# Patient Record
Sex: Male | Born: 1985 | Race: White | Hispanic: No | Marital: Married | Smoking: Never smoker
Health system: Southern US, Community
[De-identification: ages and names within clinical notes are randomized; demographics above are authoritative.]

---
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
## 2008-02-12 ENCOUNTER — Ambulatory Visit: Payer: Self-pay | Admitting: Family Medicine

## 2009-01-25 ENCOUNTER — Ambulatory Visit: Payer: Self-pay | Admitting: Internal Medicine

## 2010-09-23 IMAGING — CR RIGHT HAND - COMPLETE 3+ VIEW
1 series · 3 of 3 positions shown · non-contrast
Comparison: None

REASON FOR EXAM: injury, pain, swelling
COMMENTS:

PROCEDURE:     MDR - MDR HAND RT COMP W/OBLIQUES  - January 25, 2009  [DATE]
RESULT:     History: History: Injury

[Series 1: view not recorded · 0.17mm/px · 3 of 3 slices shown]
[im 1/3]
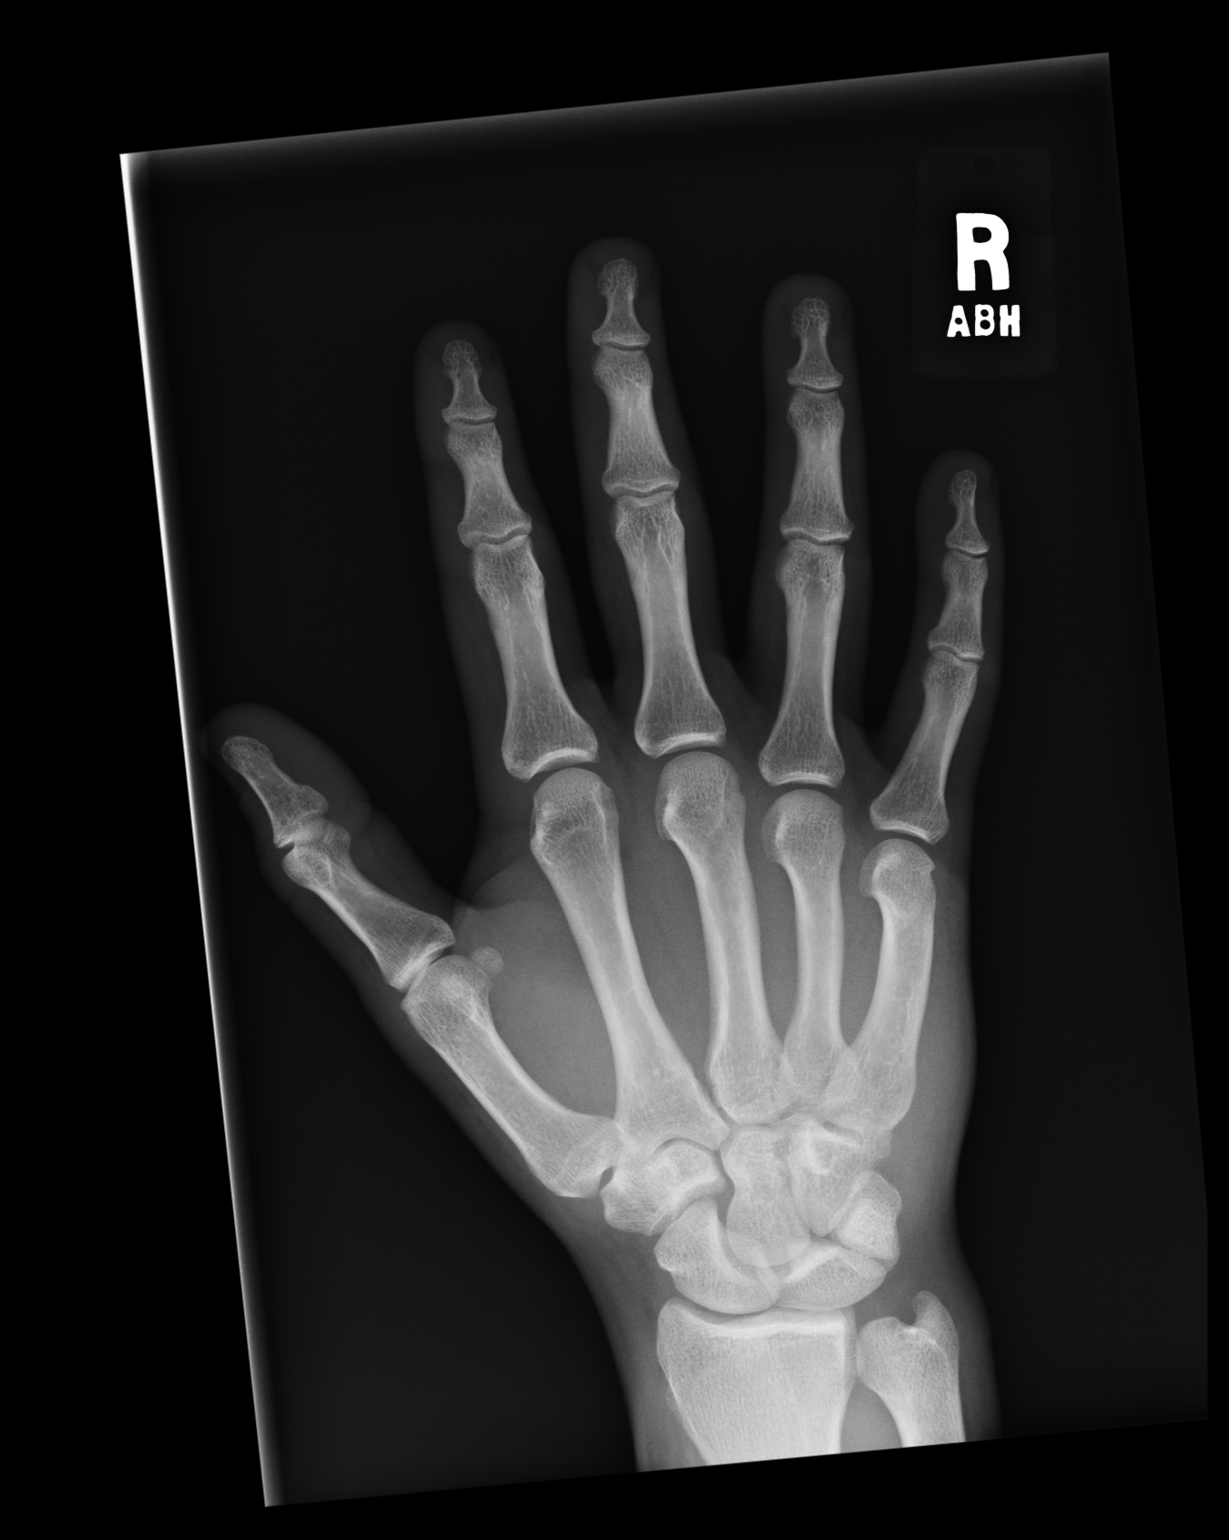
[im 2/3]
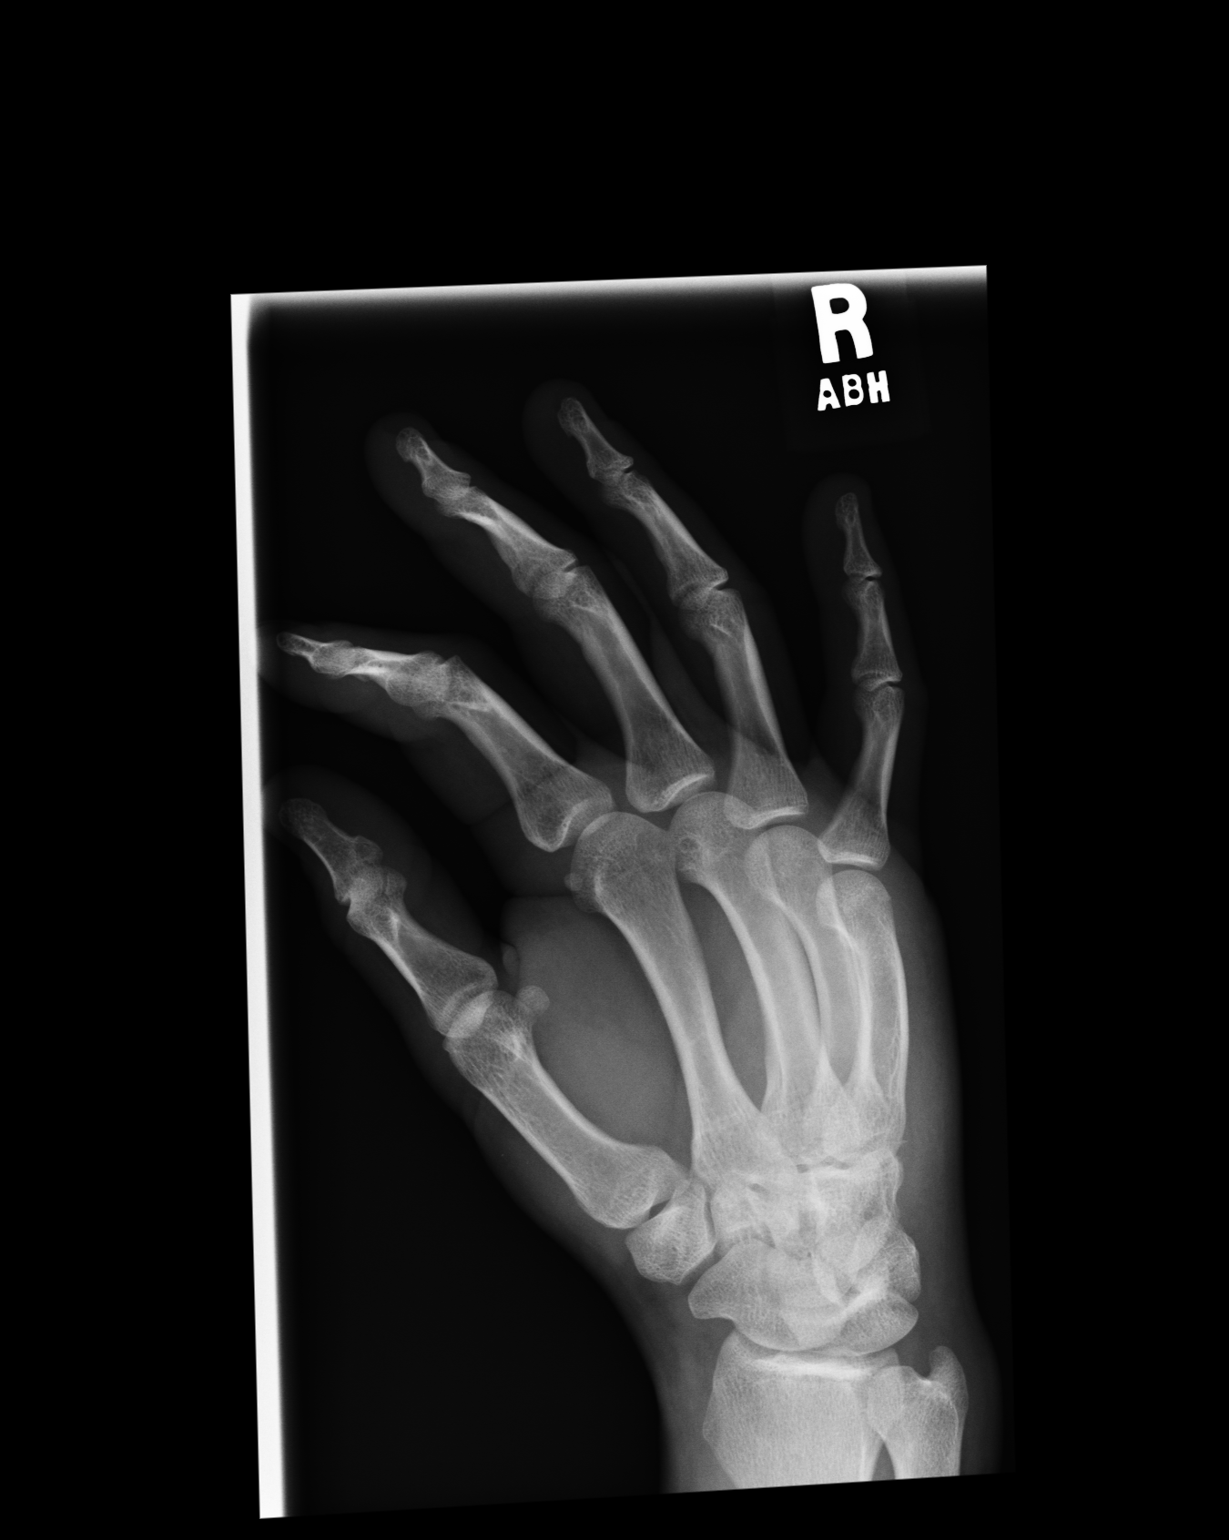
[im 3/3]
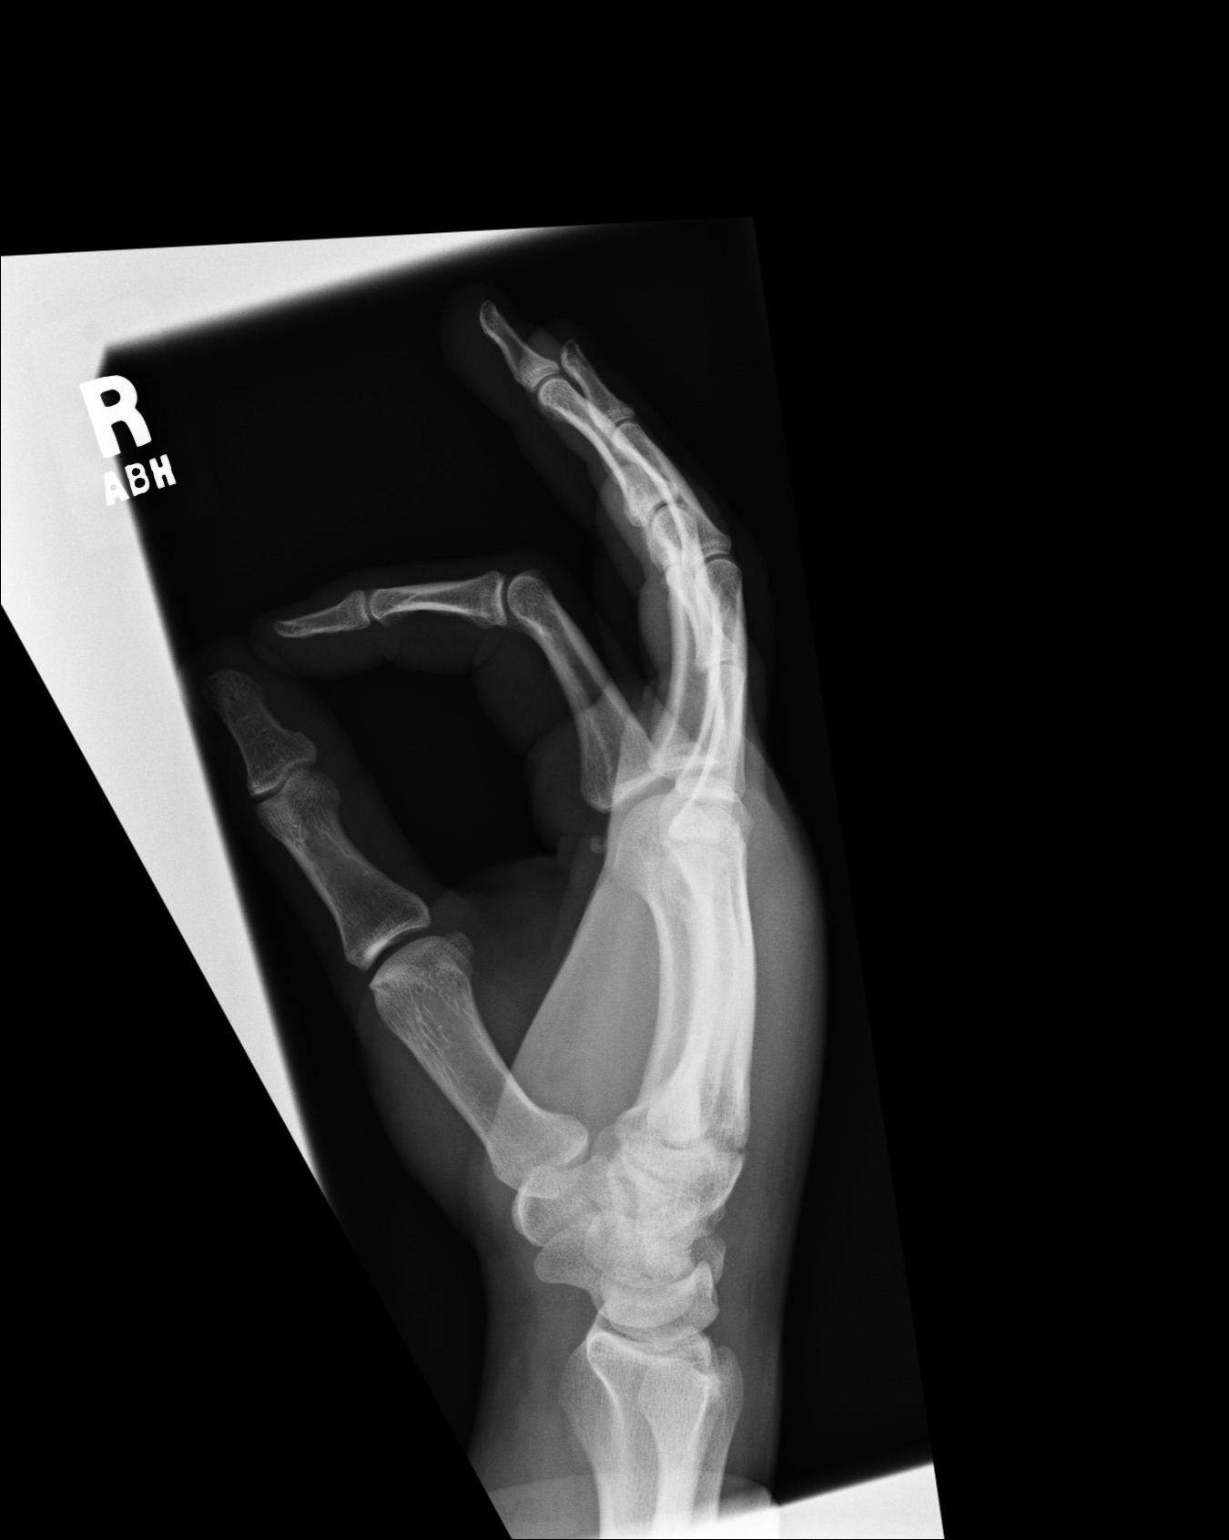

[3 of 3 positions shown; findings below may reference images not displayed]

FINDINGS: AP, oblique, and lateral views of the right hand demonstrates no fracture or
dislocation. There is normal bone mineralization. There are no erosive
changes. The joint spaces are maintained. There is no soft tissue swelling.
IMPRESSION: No acute osseous abnormality of the right hand.

## 2022-02-25 ENCOUNTER — Encounter: Payer: Self-pay | Admitting: Physician Assistant

## 2022-02-25 ENCOUNTER — Ambulatory Visit: Payer: Self-pay | Admitting: Physician Assistant

## 2022-02-25 ENCOUNTER — Ambulatory Visit: Payer: Self-pay

## 2022-02-25 VITALS — BP 136/85 | HR 82 | Temp 98.2°F | Resp 16 | Ht 76.0 in | Wt 265.0 lb

## 2022-02-25 DIAGNOSIS — M545 Low back pain, unspecified: Secondary | ICD-10-CM | POA: Insufficient documentation

## 2022-02-25 DIAGNOSIS — Z0289 Encounter for other administrative examinations: Secondary | ICD-10-CM | POA: Insufficient documentation

## 2022-02-25 DIAGNOSIS — Z814 Family history of other substance abuse and dependence: Secondary | ICD-10-CM | POA: Insufficient documentation

## 2022-02-25 DIAGNOSIS — F17200 Nicotine dependence, unspecified, uncomplicated: Secondary | ICD-10-CM | POA: Insufficient documentation

## 2022-02-25 DIAGNOSIS — Z021 Encounter for pre-employment examination: Secondary | ICD-10-CM

## 2022-02-25 DIAGNOSIS — T7840XA Allergy, unspecified, initial encounter: Secondary | ICD-10-CM | POA: Insufficient documentation

## 2022-02-25 DIAGNOSIS — Z0110 Encounter for examination of ears and hearing without abnormal findings: Secondary | ICD-10-CM | POA: Insufficient documentation

## 2022-02-25 DIAGNOSIS — H527 Unspecified disorder of refraction: Secondary | ICD-10-CM | POA: Insufficient documentation

## 2022-02-25 DIAGNOSIS — F4310 Post-traumatic stress disorder, unspecified: Secondary | ICD-10-CM | POA: Insufficient documentation

## 2022-02-25 DIAGNOSIS — K2970 Gastritis, unspecified, without bleeding: Secondary | ICD-10-CM | POA: Insufficient documentation

## 2022-02-25 LAB — POCT URINALYSIS DIPSTICK
Bilirubin, UA: NEGATIVE
Blood, UA: NEGATIVE
Glucose, UA: NEGATIVE
Ketones, UA: NEGATIVE
Leukocytes, UA: NEGATIVE
Nitrite, UA: NEGATIVE
Protein, UA: NEGATIVE
Spec Grav, UA: 1.01 (ref 1.010–1.025)
Urobilinogen, UA: 0.2 E.U./dL
pH, UA: 6 (ref 5.0–8.0)

## 2022-02-25 NOTE — Progress Notes (Unsigned)
Pt presents today to complete [employer] pre-employment physical and labs.

## 2022-02-25 NOTE — Progress Notes (Signed)
[facility]  ____________________________________________   None    (approximate)  I have reviewed the triage vital signs and the nursing notes.   HISTORY  Chief Complaint No chief complaint on file.   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  Patient voices no concerns or complaints.        History reviewed. No pertinent past medical history.  Patient Active Problem List   Diagnosis Date Noted   Allergies 02/25/2022   Family history of other substance abuse and dependence 02/25/2022   Gastritis 02/25/2022   Health examination of defined subpopulation 02/25/2022   Low back pain, unspecified 02/25/2022   Nicotine dependence 02/25/2022   Other examination of ears and hearing 02/25/2022   Post-traumatic stress disorder 02/25/2022   Refractive error 02/25/2022    History reviewed. No pertinent surgical history.  Prior to Admission medications   Medication Sig Start Date End Date Taking? Authorizing Provider  cyclobenzaprine (FLEXERIL) 10 MG tablet Take by mouth. 07/05/16  Yes [provider]  naproxen (NAPROSYN) 500 MG tablet Take by mouth. 07/09/16  Yes [provider]    Allergies Penicillins and Sulfa antibiotics  Family History  Problem Relation Age of Onset   Rheum arthritis Father     Social History Social History   Tobacco Use   Smoking status: Never   Smokeless tobacco: Current    Types: Snuff  Substance Use Topics   Alcohol use: Yes   Drug use: Never    Review of Systems Constitutional: No fever/chills Eyes: No visual changes. ENT: No sore throat. Cardiovascular: Denies chest pain. Respiratory: Denies shortness of breath. Gastrointestinal: No abdominal pain.  No nausea, no vomiting.  No diarrhea.  No constipation. Genitourinary: Negative for dysuria. Musculoskeletal: Negative for back pain. Skin: Negative for rash. Neurological:  Negative for headaches, focal weakness or numbness. Allergic/Immunilogical: Penicillin and sulfa ____________________________________________   PHYSICAL EXAM:  VITAL SIGNS: BP is 136/85, pulse 82, respirations 16, temperature 98.2, and patient is 90% O2 sat on room air.  Patient weighs 265 pounds and BMI is 32.26. Constitutional: Alert and oriented. Well appearing and in no acute distress. Eyes: Conjunctivae are normal. PERRL. EOMI. Head: Atraumatic. Nose: No congestion/rhinnorhea. Mouth/Throat: Mucous membranes are moist.  Oropharynx non-erythematous. Neck: No stridor.  No cervical spine tenderness to palpation. Hematological/Lymphatic/Immunilogical: No cervical lymphadenopathy. Cardiovascular: Normal rate, regular rhythm. Grossly normal heart sounds.  Good peripheral circulation. Respiratory: Normal respiratory effort.  No retractions. Lungs CTAB. Gastrointestinal: Soft and nontender. No distention. No abdominal bruits. No CVA tenderness. Genitourinary: Deferred Musculoskeletal: No lower extremity tenderness nor edema.  No joint effusions. Neurologic:  Normal speech and language. No gross focal neurologic deficits are appreciated. No gait instability. Skin:  Skin is warm, dry and intact. No rash noted. Psychiatric: Mood and affect are normal. Speech and behavior are normal.  ____________________________________________   LABS     Component Ref Range & Units 13:52  Color, UA  yellow   Clarity, UA  clear   Glucose, UA Negative Negative   Bilirubin, UA  negative   Ketones, UA  negative   Spec Grav, UA 1.010 - 1.025 1.010   Blood, UA  negative   pH, UA 5.0 - 8.0 6.0   Protein, UA Negative Negative   Urobilinogen, UA 0.2 or 1.0 E.U./dL 0.2   Nitrite, UA  negative   Leukocytes, UA Negative Negative   Appearance  ____________________________________________  EKG Sinus  Rhythm at 73 bpm WITHIN NORMAL  LIMITS   ____________________________________________    ____________________________________________   INITIAL IMPRESSION / ASSESSMENT AND PLAN  As part of my medical decision making, I reviewed the following data within the electronic MEDICAL RECORD NUMBER       Advise patient lab results are pending.  No acute findings on physical exam.      ____________________________________________   FINAL CLINICAL IMPRESSION Well exam   ED Discharge Orders     None        Note:  This document was prepared using Dragon voice recognition software and may include unintentional dictation errors.

## 2022-02-25 NOTE — Progress Notes (Signed)
Pt presents today to complete [employer] pre-employment physical. Pt denies any issues or concerns./CL,RMA  

## 2022-02-26 LAB — CMP12+LP+TP+TSH+6AC+CBC/D/PLT
ALT: 27 IU/L (ref 0–44)
AST: 19 IU/L (ref 0–40)
Albumin/Globulin Ratio: 2 (ref 1.2–2.2)
Albumin: 4.9 g/dL (ref 4.1–5.1)
Alkaline Phosphatase: 58 IU/L (ref 44–121)
BUN/Creatinine Ratio: 6 — ABNORMAL LOW (ref 9–20)
BUN: 9 mg/dL (ref 6–20)
Basophils Absolute: 0 10*3/uL (ref 0.0–0.2)
Basos: 1 %
Bilirubin Total: 0.4 mg/dL (ref 0.0–1.2)
Calcium: 9.9 mg/dL (ref 8.7–10.2)
Chloride: 102 mmol/L (ref 96–106)
Chol/HDL Ratio: 3.4 ratio (ref 0.0–5.0)
Cholesterol, Total: 223 mg/dL — ABNORMAL HIGH (ref 100–199)
Creatinine, Ser: 1.55 mg/dL — ABNORMAL HIGH (ref 0.76–1.27)
EOS (ABSOLUTE): 0.3 10*3/uL (ref 0.0–0.4)
Eos: 6 %
Estimated CHD Risk: 0.5 times avg. (ref 0.0–1.0)
Free Thyroxine Index: 2.2 (ref 1.2–4.9)
GGT: 83 IU/L — ABNORMAL HIGH (ref 0–65)
Globulin, Total: 2.4 g/dL (ref 1.5–4.5)
Glucose: 86 mg/dL (ref 70–99)
HDL: 65 mg/dL (ref >39)
Hematocrit: 44.4 % (ref 37.5–51.0)
Hemoglobin: 15.2 g/dL (ref 13.0–17.7)
Immature Grans (Abs): 0 10*3/uL (ref 0.0–0.1)
Immature Granulocytes: 0 %
Iron: 90 ug/dL (ref 38–169)
LDH: 146 IU/L (ref 121–224)
LDL Chol Calc (NIH): 144 mg/dL — ABNORMAL HIGH (ref 0–99)
Lymphocytes Absolute: 1.1 10*3/uL (ref 0.7–3.1)
Lymphs: 21 %
MCH: 30.5 pg (ref 26.6–33.0)
MCHC: 34.2 g/dL (ref 31.5–35.7)
MCV: 89 fL (ref 79–97)
Monocytes Absolute: 0.5 10*3/uL (ref 0.1–0.9)
Monocytes: 10 %
Neutrophils Absolute: 3.2 10*3/uL (ref 1.4–7.0)
Neutrophils: 62 %
Phosphorus: 2.9 mg/dL (ref 2.8–4.1)
Platelets: 283 10*3/uL (ref 150–450)
Potassium: 4.7 mmol/L (ref 3.5–5.2)
RBC: 4.98 x10E6/uL (ref 4.14–5.80)
RDW: 11.8 % (ref 11.6–15.4)
Sodium: 140 mmol/L (ref 134–144)
T3 Uptake Ratio: 31 % (ref 24–39)
T4, Total: 7.1 ug/dL (ref 4.5–12.0)
TSH: 1.57 u[IU]/mL (ref 0.450–4.500)
Total Protein: 7.3 g/dL (ref 6.0–8.5)
Triglycerides: 81 mg/dL (ref 0–149)
Uric Acid: 7.1 mg/dL (ref 3.8–8.4)
VLDL Cholesterol Cal: 14 mg/dL (ref 5–40)
WBC: 5.1 10*3/uL (ref 3.4–10.8)
eGFR: 60 mL/min/{1.73_m2} (ref >59)

## 2022-02-26 LAB — HEPATITIS B SURFACE ANTIBODY,QUALITATIVE: Hep B Surface Ab, Qual: REACTIVE

## 2022-04-20 DIAGNOSIS — S60862A Insect bite (nonvenomous) of left wrist, initial encounter: Secondary | ICD-10-CM | POA: Diagnosis not present

## 2023-08-25 ENCOUNTER — Ambulatory Visit: Payer: Self-pay

## 2023-08-25 DIAGNOSIS — Z0000 Encounter for general adult medical examination without abnormal findings: Secondary | ICD-10-CM

## 2023-08-25 DIAGNOSIS — R5383 Other fatigue: Secondary | ICD-10-CM

## 2023-08-25 LAB — POCT URINALYSIS DIPSTICK
Bilirubin, UA: NEGATIVE
Blood, UA: NEGATIVE
Glucose, UA: NEGATIVE
Ketones, UA: NEGATIVE
Leukocytes, UA: NEGATIVE
Nitrite, UA: NEGATIVE
Protein, UA: NEGATIVE
Spec Grav, UA: <=1.005 — AB (ref 1.010–1.025)
Urobilinogen, UA: 0.2 U/dL
pH, UA: 7 (ref 5.0–8.0)

## 2023-08-26 LAB — TESTOSTERONE: Testosterone: 345 ng/dL (ref 264–916)

## 2023-08-30 ENCOUNTER — Ambulatory Visit: Payer: Self-pay | Admitting: Physician Assistant

## 2023-08-30 VITALS — BP 142/93 | HR 90 | Temp 97.6°F | Resp 16 | Ht 76.0 in | Wt 260.0 lb

## 2023-08-30 DIAGNOSIS — Z0000 Encounter for general adult medical examination without abnormal findings: Secondary | ICD-10-CM

## 2023-08-30 NOTE — Progress Notes (Signed)
 Pt presents today to complete [facility] physical, Pt request to go over labs. Frann Ivans

## 2023-08-30 NOTE — Progress Notes (Signed)
   [facility] ____________________________________________   None    (approximate)  I have reviewed the triage vital signs and the nursing notes.   HISTORY  Chief Complaint No chief complaint on file.   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  Voices no concerns or complaints         Patient Active Problem List   Diagnosis Date Noted   Allergies 02/25/2022   Family history of other substance abuse and dependence 02/25/2022   Gastritis 02/25/2022   Health examination of defined subpopulation 02/25/2022   Low back pain, unspecified 02/25/2022   Nicotine dependence 02/25/2022   Other examination of ears and hearing 02/25/2022   Post-traumatic stress disorder 02/25/2022   Refractive error 02/25/2022    No past surgical history on file.  Prior to Admission medications   Not on File    Allergies Penicillins and Sulfa antibiotics  Family History  Problem Relation Age of Onset   Rheum arthritis Father     Social History Social History   Tobacco Use   Smoking status: Never   Smokeless tobacco: Current    Types: Snuff  Substance Use Topics   Alcohol use: Yes   Drug use: Never    Review of Systems Constitutional: No fever/chills Eyes: No visual changes. ENT: No sore throat. Cardiovascular: Denies chest pain. Respiratory: Denies shortness of breath. Gastrointestinal: No abdominal pain.  No nausea, no vomiting.  No diarrhea.  No constipation. Genitourinary: Negative for dysuria. Musculoskeletal: Negative for back pain. Skin: Negative for rash. Neurological: Negative for headaches, focal weakness or numbness. Psychiatric: PTSD Allergic/Immunilogical: Penicillin and sulfa  ____________________________________________   PHYSICAL EXAM:  VITAL SIGNS: BP 142/93BP. 142/93. Data is abnormal. Taken on 08/30/23 9:46 AM  Cuff Size Large  Pulse Rate 90  Temp 97.6 F (36.4 C)   Weight 260 lb (117.9 kg)  Height 6\' 4"  (1.93 m)  Resp 16  SpO2 100 %   Constitutional: Alert and oriented. Well appearing and in no acute distress. Eyes: Conjunctivae are normal. PERRL. EOMI. Head: Atraumatic. Nose: No congestion/rhinnorhea. Mouth/Throat: Mucous membranes are moist.  Oropharynx non-erythematous. Neck: No stridor.  No cervical spine tenderness to palpation. Hematological/Lymphatic/Immunilogical: No cervical lymphadenopathy. Cardiovascular: Normal rate, regular rhythm. Grossly normal heart sounds.  Good peripheral circulation. Respiratory: Normal respiratory effort.  No retractions. Lungs CTAB. Gastrointestinal: Soft and nontender. No distention. No abdominal bruits. No CVA tenderness. Genitourinary: Deferred Musculoskeletal: No lower extremity tenderness nor edema.  No joint effusions. Neurologic:  Normal speech and language. No gross focal neurologic deficits are appreciated. No gait instability. Skin:  Skin is warm, dry and intact. No rash noted. Psychiatric: Mood and affect are normal. Speech and behavior are normal.  ____________________________________________   LABS _Pending ___________________________________________  EKG  Sinus bradycardia 57 beats ____________________________________________    ____________________________________________   INITIAL IMPRESSION / ASSESSMENT AND PLAN  As part of my medical decision making, I reviewed the following data within the electronic MEDICAL RECORD NUMBER           No acute findings on physical exam and EKG.  Labs pending.   ____________________________________________   FINAL CLINICAL IMPRESSION Well exam   ED Discharge Orders     None        Note:  This document was prepared using Dragon voice recognition software and may include unintentional dictation errors.

## 2023-09-02 ENCOUNTER — Other Ambulatory Visit: Payer: Self-pay

## 2023-09-02 DIAGNOSIS — Z021 Encounter for pre-employment examination: Secondary | ICD-10-CM

## 2023-09-02 NOTE — Progress Notes (Signed)
Return to [facility] to redraw lab panel.  [facility] sent a message that gel tube was missing from the original lab draw.

## 2023-09-03 LAB — CMP12+LP+TP+TSH+6AC+CBC/D/PLT
ALT: 28 [IU]/L (ref 0–44)
AST: 26 [IU]/L (ref 0–40)
Albumin: 4.6 g/dL (ref 4.1–5.1)
Alkaline Phosphatase: 62 [IU]/L (ref 44–121)
BUN/Creatinine Ratio: 17 (ref 9–20)
BUN: 18 mg/dL (ref 6–20)
Basophils Absolute: 0 10*3/uL (ref 0.0–0.2)
Basos: 0 %
Bilirubin Total: 0.6 mg/dL (ref 0.0–1.2)
Calcium: 9.7 mg/dL (ref 8.7–10.2)
Chloride: 103 mmol/L (ref 96–106)
Chol/HDL Ratio: 3.6 {ratio} (ref 0.0–5.0)
Cholesterol, Total: 227 mg/dL — ABNORMAL HIGH (ref 100–199)
Creatinine, Ser: 1.06 mg/dL (ref 0.76–1.27)
EOS (ABSOLUTE): 0 10*3/uL (ref 0.0–0.4)
Eos: 1 %
Estimated CHD Risk: 0.6 times avg. (ref 0.0–1.0)
Free Thyroxine Index: 2.1 (ref 1.2–4.9)
GGT: 50 [IU]/L (ref 0–65)
Globulin, Total: 2.5 g/dL (ref 1.5–4.5)
Glucose: 102 mg/dL — ABNORMAL HIGH (ref 70–99)
HDL: 63 mg/dL (ref >39)
Hematocrit: 44.7 % (ref 37.5–51.0)
Hemoglobin: 15 g/dL (ref 13.0–17.7)
Immature Grans (Abs): 0 10*3/uL (ref 0.0–0.1)
Immature Granulocytes: 0 %
Iron: 157 ug/dL (ref 38–169)
LDH: 155 [IU]/L (ref 121–224)
LDL Chol Calc (NIH): 137 mg/dL — ABNORMAL HIGH (ref 0–99)
Lymphocytes Absolute: 1 10*3/uL (ref 0.7–3.1)
Lymphs: 20 %
MCH: 30.7 pg (ref 26.6–33.0)
MCHC: 33.6 g/dL (ref 31.5–35.7)
MCV: 92 fL (ref 79–97)
Monocytes Absolute: 0.4 10*3/uL (ref 0.1–0.9)
Monocytes: 9 %
Neutrophils Absolute: 3.3 10*3/uL (ref 1.4–7.0)
Neutrophils: 70 %
Phosphorus: 3 mg/dL (ref 2.8–4.1)
Platelets: 280 10*3/uL (ref 150–450)
Potassium: 4.6 mmol/L (ref 3.5–5.2)
RBC: 4.88 x10E6/uL (ref 4.14–5.80)
RDW: 11.8 % (ref 11.6–15.4)
Sodium: 142 mmol/L (ref 134–144)
T3 Uptake Ratio: 31 % (ref 24–39)
T4, Total: 6.8 ug/dL (ref 4.5–12.0)
TSH: 2.59 u[IU]/mL (ref 0.450–4.500)
Total Protein: 7.1 g/dL (ref 6.0–8.5)
Triglycerides: 151 mg/dL — ABNORMAL HIGH (ref 0–149)
Uric Acid: 7.8 mg/dL (ref 3.8–8.4)
VLDL Cholesterol Cal: 27 mg/dL (ref 5–40)
WBC: 4.7 10*3/uL (ref 3.4–10.8)
eGFR: 93 mL/min/{1.73_m2} (ref >59)

## 2023-09-06 LAB — CMP12+LP+TP+TSH+6AC+CBC/D/PLT
Basophils Absolute: 0 10*3/uL (ref 0.0–0.2)
Basos: 1 %
EOS (ABSOLUTE): 0.1 10*3/uL (ref 0.0–0.4)
Eos: 1 %
Hematocrit: 43.7 % (ref 37.5–51.0)
Hemoglobin: 14.3 g/dL (ref 13.0–17.7)
Immature Grans (Abs): 0 10*3/uL (ref 0.0–0.1)
Immature Granulocytes: 0 %
Lymphocytes Absolute: 0.9 10*3/uL (ref 0.7–3.1)
Lymphs: 27 %
MCH: 30 pg (ref 26.6–33.0)
MCHC: 32.7 g/dL (ref 31.5–35.7)
MCV: 92 fL (ref 79–97)
Monocytes Absolute: 0.4 10*3/uL (ref 0.1–0.9)
Monocytes: 11 %
Neutrophils Absolute: 2.1 10*3/uL (ref 1.4–7.0)
Neutrophils: 60 %
Platelets: 275 10*3/uL (ref 150–450)
RBC: 4.76 x10E6/uL (ref 4.14–5.80)
RDW: 11.8 % (ref 11.6–15.4)
WBC: 3.5 10*3/uL (ref 3.4–10.8)

## 2023-10-27 DIAGNOSIS — Z7729 Contact with and (suspected ) exposure to other hazardous substances: Secondary | ICD-10-CM | POA: Insufficient documentation
